# Patient Record
Sex: Male | Born: 2005 | Race: White | Hispanic: No | Marital: Single | Smoking: Never smoker
Health system: Southern US, Community
[De-identification: ages and names within clinical notes are randomized; demographics above are authoritative.]

## PROBLEM LIST (undated history)

## (undated) DIAGNOSIS — K219 Gastro-esophageal reflux disease without esophagitis: Secondary | ICD-10-CM

## (undated) DIAGNOSIS — G8929 Other chronic pain: Secondary | ICD-10-CM

---
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
## 2023-03-05 ENCOUNTER — Ambulatory Visit: Payer: 59 | Admitting: Nurse Practitioner

## 2023-03-05 ENCOUNTER — Encounter: Payer: Self-pay | Admitting: Nurse Practitioner

## 2023-03-05 VITALS — BP 108/62 | HR 84 | Temp 98.6°F | Ht 69.0 in | Wt 132.0 lb

## 2023-03-05 DIAGNOSIS — K219 Gastro-esophageal reflux disease without esophagitis: Secondary | ICD-10-CM

## 2023-03-05 DIAGNOSIS — Z0001 Encounter for general adult medical examination with abnormal findings: Secondary | ICD-10-CM | POA: Insufficient documentation

## 2023-03-05 MED ORDER — OMEPRAZOLE 20 MG PO CPDR: 20.0000 mg | DELAYED_RELEASE_CAPSULE | Freq: Every day | ORAL | 3 refills | Status: DC

## 2023-03-05 NOTE — Assessment & Plan Note (Addendum)
He experiences recurrent episodes of severe stomach pain and vomiting, with a history of epigastric pain. Previously, omeprazole provided some relief. Symptoms seem consistent with acid reflux. We will start omeprazole 20 mg daily and advise him to monitor his diet, avoiding spicy, greasy, and acidic foods, caffeine, and alcohol. Discussed increasing dose to 40mg  daily if 20mg  is not providing enough relief. He should check in if symptoms worsen or change. Dietary information handout provided to patient.

## 2023-03-05 NOTE — Progress Notes (Signed)
Bethanie Dicker, NP-C Phone: [phone]  Allen Kim is a 17 y.o. male who presents today to establish care and for annual exam.   Discussed the use of AI scribe software for clinical note transcription with the patient, who gave verbal consent to proceed.  History of Present Illness   The 17 year old patient, with no significant past medical history or daily medications, presents with recurrent episodes of severe stomachache. The patient describes the pain as a burning sensation, more intense than any previous stomachaches. These episodes, which have occurred three times over the past semester, often result in vomiting, typically of acid first thing in the morning. The patient suspects these episodes may be related to inadequate food intake due to an irregular eating schedule at school.   Several years ago, the patient experienced epigastric pain, which was notably severe at night. This pain has since decreased in frequency and intensity, occurring approximately twice in the past year and lasting no more than two hours. During that time, the patient was prescribed omeprazole, which provided short-term relief. The patient still has some of this medication.  The patient maintains an active lifestyle, including weightlifting and playing tennis. He is sexually active and has received both the flu and 4 COVID vaccines. The patient denies any chest pain, shortness of breath, difficulty using the bathroom, painful sex, headaches, dizziness, skin changes, rashes, swelling in the legs, joint pains, and significant mood, anxiety, or depression issues.      Active Ambulatory Problems    Diagnosis Date Noted   Encounter for routine adult health examination with abnormal findings 03/05/2023   Gastroesophageal reflux disease without esophagitis 03/05/2023   Resolved Ambulatory Problems    Diagnosis Date Noted   No Resolved Ambulatory Problems   No Additional Past Medical History    Family History   Problem Relation Age of Onset   Diabetes Father    Stroke Maternal Grandmother    Diabetes Maternal Grandmother    Cancer Maternal Grandfather        prostate   Hearing loss Maternal Grandfather    Diabetes Paternal Grandmother    Mental illness Paternal Grandmother    Cancer Paternal Grandfather        melanoma   Hearing loss Paternal Grandfather     Social History   Socioeconomic History   Marital status: Single    Spouse name: Not on file   Number of children: Not on file   Years of education: Not on file   Highest education level: Not on file  Occupational History   Not on file  Tobacco Use   Smoking status: Never   Smokeless tobacco: Never  Substance and Sexual Activity   Alcohol use: Never   Drug use: Never   Sexual activity: Never  Other Topics Concern   Not on file  Social History Narrative   Not on file   Social Drivers of Health   Financial Resource Strain: Not on file  Food Insecurity: Not on file  Transportation Needs: Not on file  Physical Activity: Not on file  Stress: Not on file  Social Connections: Not on file  Intimate Partner Violence: Not on file    ROS  General:  Negative for unexplained weight loss, fever Skin: Negative for new or changing mole, sore that won't heal HEENT: Negative for trouble hearing, trouble seeing, ringing in ears, mouth sores, hoarseness, change in voice, dysphagia. CV:  Negative for chest pain, dyspnea, edema, palpitations Resp: Negative for cough, dyspnea, hemoptysis GI:  Negative for nausea, vomiting, diarrhea, constipation, melena, hematochezia. GU: Negative for dysuria, incontinence, urinary hesitance, hematuria, vaginal or penile discharge, polyuria, sexual difficulty, lumps in testicle or breasts MSK: Negative for muscle cramps or aches, joint pain or swelling Neuro: Negative for headaches, weakness, numbness, dizziness, passing out/fainting Psych: Negative for depression, anxiety, memory  problems  Objective  Physical Exam Vitals:   03/05/23 0948  BP: 108/62  Pulse: 84  Temp: 98.6 F (37 C)  SpO2: 99%    BP Readings from Last 3 Encounters:  03/05/23 108/62  02/11/18 (!) 113/58   Wt Readings from Last 3 Encounters:  03/05/23 132 lb (59.9 kg) (15%, Z= -1.05)*  02/11/18 95 lb 7.4 oz (43.3 kg) (11%, Z= -1.21)*   * Growth percentiles are based on CDC (Boys, 2-20 Years) data.    Physical Exam Constitutional:      General: He is not in acute distress.    Appearance: Normal appearance.  HENT:     Head: Normocephalic.     Right Ear: Tympanic membrane normal.     Left Ear: Tympanic membrane normal.     Nose: Nose normal.     Mouth/Throat:     Mouth: Mucous membranes are moist.     Pharynx: Oropharynx is clear.  Eyes:     Conjunctiva/sclera: Conjunctivae normal.     Pupils: Pupils are equal, round, and reactive to light.  Neck:     Thyroid: No thyromegaly.  Cardiovascular:     Rate and Rhythm: Normal rate and regular rhythm.     Heart sounds: Normal heart sounds.  Pulmonary:     Effort: Pulmonary effort is normal.     Breath sounds: Normal breath sounds.  Abdominal:     General: Abdomen is flat. Bowel sounds are normal.     Palpations: Abdomen is soft. There is no mass.     Tenderness: There is no abdominal tenderness.  Musculoskeletal:        General: Normal range of motion.  Lymphadenopathy:     Cervical: No cervical adenopathy.  Skin:    General: Skin is warm and dry.     Findings: No rash.  Neurological:     General: No focal deficit present.     Mental Status: He is alert.  Psychiatric:        Mood and Affect: Mood normal.        Behavior: Behavior normal.    Assessment/Plan:  Encounter for routine adult health examination with abnormal findings Assessment & Plan: Physical exam complete. Declined lab work today which I feel is reasonable given age and health status. He is up to date on vaccines. We encourage him to maintain regular  exercise and a balanced diet, continue with regular dental and eye check-ups, and contact us if any new issues arise. Return to care in one year, sooner as needed.    Gastroesophageal reflux disease without esophagitis Assessment & Plan: He experiences recurrent episodes of severe stomach pain and vomiting, with a history of epigastric pain. Previously, omeprazole provided some relief. Symptoms seem consistent with acid reflux. We will start omeprazole 20 mg daily and advise him to monitor his diet, avoiding spicy, greasy, and acidic foods, caffeine, and alcohol. Discussed increasing dose to 40mg  daily if 20mg  is not providing enough relief. He should check in if symptoms worsen or change. Dietary information handout provided to patient.   Orders: -     Omeprazole; Take 1 capsule (20 mg total) by mouth daily.  Dispense: 90  capsule; Refill: 3   Return in about 1 year (around 03/04/2024) for Annual Exam, sooner as needed.   Bethanie Dicker, NP-C [facility] [employer]

## 2023-03-05 NOTE — Assessment & Plan Note (Signed)
Physical exam complete. Declined lab work today which I feel is reasonable given age and health status. He is up to date on vaccines. We encourage him to maintain regular exercise and a balanced diet, continue with regular dental and eye check-ups, and contact us if any new issues arise. Return to care in one year, sooner as needed.

## 2023-07-01 ENCOUNTER — Other Ambulatory Visit (HOSPITAL_COMMUNITY): Payer: Self-pay

## 2023-07-01 ENCOUNTER — Telehealth: Payer: Self-pay

## 2023-07-01 NOTE — Telephone Encounter (Signed)
 Pharmacy Patient Advocate Encounter   Received notification from CoverMyMeds that prior authorization for Omeprazole 20MG  dr capsules is required/requested.   Insurance verification completed.   The patient is insured through [facility] .   Per test claim: PA required; PA submitted to above mentioned insurance via CoverMyMeds Key/confirmation #/EOC [id number] Status is pending

## 2023-07-01 NOTE — Telephone Encounter (Signed)
 Pharmacy Patient Advocate Encounter  Received notification from [facility] that Prior Authorization for OMEPRAZOLE 20MG  has been APPROVED from 07/01/2023 to 06/30/2024. Ran test claim, Copay is $1.03/ 30 DAY SUPPLY. This test claim was processed through [facility]- copay amounts may vary at other pharmacies due to pharmacy/plan contracts, or as the patient moves through the different stages of their insurance plan.   PA #/Case ID/Reference #: [id number]

## 2023-07-03 DIAGNOSIS — M9903 Segmental and somatic dysfunction of lumbar region: Secondary | ICD-10-CM | POA: Diagnosis not present

## 2023-07-03 DIAGNOSIS — M546 Pain in thoracic spine: Secondary | ICD-10-CM | POA: Diagnosis not present

## 2023-07-03 DIAGNOSIS — M542 Cervicalgia: Secondary | ICD-10-CM | POA: Diagnosis not present

## 2023-07-03 DIAGNOSIS — M9901 Segmental and somatic dysfunction of cervical region: Secondary | ICD-10-CM | POA: Diagnosis not present

## 2023-07-03 DIAGNOSIS — M9902 Segmental and somatic dysfunction of thoracic region: Secondary | ICD-10-CM | POA: Diagnosis not present

## 2023-08-10 ENCOUNTER — Ambulatory Visit: Payer: Self-pay

## 2023-08-16 ENCOUNTER — Ambulatory Visit: Payer: Self-pay

## 2023-08-16 DIAGNOSIS — H6592 Unspecified nonsuppurative otitis media, left ear: Secondary | ICD-10-CM | POA: Diagnosis not present

## 2023-08-16 DIAGNOSIS — H66002 Acute suppurative otitis media without spontaneous rupture of ear drum, left ear: Secondary | ICD-10-CM | POA: Diagnosis not present

## 2023-08-18 DIAGNOSIS — M9903 Segmental and somatic dysfunction of lumbar region: Secondary | ICD-10-CM | POA: Diagnosis not present

## 2023-08-18 DIAGNOSIS — M546 Pain in thoracic spine: Secondary | ICD-10-CM | POA: Diagnosis not present

## 2023-08-18 DIAGNOSIS — M9901 Segmental and somatic dysfunction of cervical region: Secondary | ICD-10-CM | POA: Diagnosis not present

## 2023-08-18 DIAGNOSIS — M9902 Segmental and somatic dysfunction of thoracic region: Secondary | ICD-10-CM | POA: Diagnosis not present

## 2023-08-18 DIAGNOSIS — M542 Cervicalgia: Secondary | ICD-10-CM | POA: Diagnosis not present

## 2023-10-03 ENCOUNTER — Other Ambulatory Visit: Payer: Self-pay

## 2023-10-03 ENCOUNTER — Encounter: Payer: Self-pay | Admitting: Nurse Practitioner

## 2023-10-03 ENCOUNTER — Encounter: Payer: Self-pay | Admitting: Emergency Medicine

## 2023-10-03 ENCOUNTER — Ambulatory Visit

## 2023-10-03 DIAGNOSIS — D72829 Elevated white blood cell count, unspecified: Secondary | ICD-10-CM | POA: Insufficient documentation

## 2023-10-03 DIAGNOSIS — R1084 Generalized abdominal pain: Secondary | ICD-10-CM | POA: Diagnosis not present

## 2023-10-03 DIAGNOSIS — K529 Noninfective gastroenteritis and colitis, unspecified: Secondary | ICD-10-CM | POA: Insufficient documentation

## 2023-10-03 DIAGNOSIS — R197 Diarrhea, unspecified: Secondary | ICD-10-CM | POA: Diagnosis not present

## 2023-10-03 NOTE — ED Triage Notes (Signed)
 Pt arrives c/o upper/mid abdominal pain x 3 days. Pt states this pain has been a recurring problem the past couple months, and pain usually subsides after one or two days but now patient is on day three and is c/o n/v/d. Seen at UC earlier but denies imaging studies.

## 2023-10-04 ENCOUNTER — Emergency Department

## 2023-10-04 ENCOUNTER — Emergency Department
Admission: EM | Admit: 2023-10-04 | Discharge: 2023-10-04 | Disposition: A | Discharge status: Home / Self Care | Attending: Emergency Medicine | Admitting: Emergency Medicine

## 2023-10-04 DIAGNOSIS — R109 Unspecified abdominal pain: Secondary | ICD-10-CM | POA: Diagnosis not present

## 2023-10-04 DIAGNOSIS — K529 Noninfective gastroenteritis and colitis, unspecified: Secondary | ICD-10-CM

## 2023-10-04 DIAGNOSIS — R1084 Generalized abdominal pain: Secondary | ICD-10-CM

## 2023-10-04 DIAGNOSIS — R188 Other ascites: Secondary | ICD-10-CM | POA: Diagnosis not present

## 2023-10-04 LAB — LIPASE, BLOOD: Lipase: 25 U/L (ref 11–51)

## 2023-10-04 LAB — URINALYSIS, ROUTINE W REFLEX MICROSCOPIC
Bacteria, UA: NONE SEEN
Bilirubin Urine: NEGATIVE
Glucose, UA: NEGATIVE mg/dL
Hgb urine dipstick: NEGATIVE
Ketones, ur: 20 mg/dL — AB
Leukocytes,Ua: NEGATIVE
Nitrite: NEGATIVE
Protein, ur: 30 mg/dL — AB
Specific Gravity, Urine: 1.017 (ref 1.005–1.030)
Squamous Epithelial / HPF: 0 /HPF (ref 0–5)
pH: 6 (ref 5.0–8.0)

## 2023-10-04 LAB — COMPREHENSIVE METABOLIC PANEL WITH GFR
ALT: 12 U/L (ref 0–44)
AST: 14 U/L — ABNORMAL LOW (ref 15–41)
Albumin: 4.2 g/dL (ref 3.5–5.0)
Alkaline Phosphatase: 59 U/L (ref 38–126)
Anion gap: 13 (ref 5–15)
BUN: 11 mg/dL (ref 6–20)
CO2: 25 mmol/L (ref 22–32)
Calcium: 9.3 mg/dL (ref 8.9–10.3)
Chloride: 97 mmol/L — ABNORMAL LOW (ref 98–111)
Creatinine, Ser: 0.87 mg/dL (ref 0.61–1.24)
GFR, Estimated: >60 mL/min (ref >60)
Glucose, Bld: 115 mg/dL — ABNORMAL HIGH (ref 70–99)
Potassium: 3.5 mmol/L (ref 3.5–5.1)
Sodium: 135 mmol/L (ref 135–145)
Total Bilirubin: 1.7 mg/dL — ABNORMAL HIGH (ref 0.0–1.2)
Total Protein: 7.8 g/dL (ref 6.5–8.1)

## 2023-10-04 LAB — CBC
HCT: 42.6 % (ref 39.0–52.0)
Hemoglobin: 15.1 g/dL (ref 13.0–17.0)
MCH: 29.3 pg (ref 26.0–34.0)
MCHC: 35.4 g/dL (ref 30.0–36.0)
MCV: 82.6 fL (ref 80.0–100.0)
Platelets: 293 K/uL (ref 150–400)
RBC: 5.16 MIL/uL (ref 4.22–5.81)
RDW: 12 % (ref 11.5–15.5)
WBC: 15.9 K/uL — ABNORMAL HIGH (ref 4.0–10.5)
nRBC: 0 % (ref 0.0–0.2)

## 2023-10-04 MED ORDER — ONDANSETRON 4 MG PO TBDP: ORAL_TABLET | ORAL | 0 refills | Status: DC

## 2023-10-04 MED ORDER — IOHEXOL 300 MG/ML  SOLN
100.0000 mL | Freq: Once | INTRAMUSCULAR | Status: AC | PRN
  Administered 2023-10-04: 100 mL via INTRAVENOUS

## 2023-10-04 MED ORDER — ONDANSETRON HCL 4 MG/2ML IJ SOLN
4.0000 mg | INTRAMUSCULAR | Status: AC
  Administered 2023-10-04: 4 mg via INTRAVENOUS
  Filled 2023-10-04: qty 2

## 2023-10-04 MED ORDER — MORPHINE SULFATE (PF) 4 MG/ML IV SOLN
4.0000 mg | Freq: Once | INTRAVENOUS | Status: AC
  Administered 2023-10-04: 4 mg via INTRAVENOUS
  Filled 2023-10-04: qty 1

## 2023-10-04 MED ORDER — LACTATED RINGERS IV BOLUS
1000.0000 mL | Freq: Once | INTRAVENOUS | Status: AC
  Administered 2023-10-04: 1000 mL via INTRAVENOUS

## 2023-10-04 MED ORDER — HYDROCODONE-ACETAMINOPHEN 5-325 MG PO TABS: 2.0000 | ORAL_TABLET | Freq: Four times a day (QID) | ORAL | 0 refills | Status: DC | PRN

## 2023-10-04 NOTE — Discharge Instructions (Addendum)
 As we discussed, your evaluation was generally reassuring and your symptoms are caused by a condition called colitis, which is an infectious or inflammatory condition of the large intestine (the colon) they can lead to all the symptoms you are describing.  It typically resolves on its own and we do not usually prescribe antibiotics for this type of condition.  However, for an otherwise healthy young person with these kinds of symptoms, we strongly encourage you to follow-up with your primary care provider and/or with a GI specialist such as Dr. Unk.  They may want to do some additional testing, such as an outpatient test called a fecal calprotectin, or imaging such as a colonoscopy and upper endoscopy, to further investigate the possibility of inflammatory bowel disease (IBD) such as Crohn's disease or ulcerative colitis.  We included some information about Crohn's disease for you to read, but please understand that this is NOT your diagnosis, just a possibility that you may want to further investigate as an outpatient.  You can use over-the-counter ibuprofen and/or Tylenol  according to label instructions as needed for pain.  Take Norco as prescribed for severe pain. Do not drink alcohol, drive or participate in any other potentially dangerous activities while taking this medication as it may make you sleepy. Do not take this medication with any other sedating medications, either prescription or over-the-counter. If you were prescribed Percocet or Vicodin, do not take these with acetaminophen  (Tylenol ) as it is already contained within these medications.   This medication is an opiate (or narcotic) pain medication and can be habit forming.  Use it as little as possible to achieve adequate pain control.  Do not use or use it with extreme caution if you have a history of opiate abuse or dependence.  If you are on a pain contract with your primary care doctor or a pain specialist, be sure to let them know you  were prescribed this medication today from the [facility] Emergency Department.  This medication is intended for your use only - do not give any to anyone else and keep it in a secure place where nobody else, especially children, have access to it.  It will also cause or worsen constipation, so you may want to consider taking an over-the-counter stool softener while you are taking this medication.    Return to the emergency department if you develop new or worsening symptoms that concern you.

## 2023-10-04 NOTE — ED Provider Notes (Signed)
 [facility] Provider Note    Event Date/Time   First MD Initiated Contact with Patient 10/04/23 0028     (approximate)   History   Abdominal Pain   HPI Allen Kim is a 18 y.o. male with no chronic medical issues and who is otherwise healthy and active.  He presents for evaluation of pain in the middle and lower part of his abdomen that has been present for about 3 days but is worse today.  He said it is happened 4 or 5 times over the last year and does not seem to be related to anything in particular such as eating.  He has some nausea and vomiting.  He said he was constipated the previous day, then yesterday it was more diarrhea, and then by tonight there was some blood mixed in with the stool which is never happened before.  He went to an urgent care earlier but did not receive any imaging.  He said the pain is little bit better now than it was before but is still persistent.  It does not radiate to his back nor down into his groin nor genitals.     Physical Exam   Triage Vital Signs: ED Triage Vitals  Encounter Vitals Group     BP 10/03/23 2355 124/76     Girls Systolic BP Percentile --      Girls Diastolic BP Percentile --      Boys Systolic BP Percentile --      Boys Diastolic BP Percentile --      Pulse Rate 10/03/23 2355 (!) 114     Resp 10/03/23 2355 18     Temp 10/03/23 2355 99.5 F (37.5 C)     Temp src --      SpO2 10/03/23 2355 98 %     Weight 10/03/23 2356 55.8 kg (123 lb)     Height 10/03/23 2356 1.753 m (5' 9)     Head Circumference --      Peak Flow --      Pain Score 10/03/23 2356 5     Pain Loc --      Pain Education --      Exclude from Growth Chart --     Most recent vital signs: Vitals:   10/04/23 0230 10/04/23 0300  BP: 120/72 119/68  Pulse: 91 92  Resp:  18  Temp:    SpO2: 100% 99%    General: Awake, appears a little bit uncomfortable but not in severe distress.  Appears generally healthy. CV:  Good  peripheral perfusion.  Tachycardia, regular rhythm. Resp:  Normal effort. Speaking easily and comfortably, no accessory muscle usage nor intercostal retractions.   Abd:  Thin and muscular body habitus.  Abdomen soft with guarding and tenderness to palpation throughout, no specific tenderness in the epigastrium nor right upper quadrant.  Tenderness is worse in the lower middle part of the abdomen in particular the periumbilical region.   ED Results / Procedures / Treatments   Labs (all labs ordered are listed, but only abnormal results are displayed) Labs Reviewed  COMPREHENSIVE METABOLIC PANEL WITH GFR - Abnormal; Notable for the following components:      Result Value   Chloride 97 (*)    Glucose, Bld 115 (*)    AST 14 (*)    Total Bilirubin 1.7 (*)    All other components within normal limits  CBC - Abnormal; Notable for the following components:   WBC 15.9 (*)  All other components within normal limits  URINALYSIS, ROUTINE W REFLEX MICROSCOPIC - Abnormal; Notable for the following components:   Color, Urine YELLOW (*)    APPearance CLEAR (*)    Ketones, ur 20 (*)    Protein, ur 30 (*)    All other components within normal limits  LIPASE, BLOOD      RADIOLOGY See ED course for details   PROCEDURES:  Critical Care performed: No  Procedures    IMPRESSION / MDM / ASSESSMENT AND PLAN / ED COURSE  I reviewed the triage vital signs and the nursing notes.                              Differential diagnosis includes, but is not limited to, appendicitis, biliary colic, IBD.  Patient's presentation is most consistent with acute presentation with potential threat to life or bodily function.  Labs/studies ordered: CMP, lipase, CBC, urinalysis, CT of the abdomen and pelvis with oral and IV contrast.  Interventions/Medications given:  Medications  lactated ringers  bolus 1,000 mL (0 mLs Intravenous Stopped 10/04/23 0329)  morphine  (PF) 4 MG/ML injection 4 mg (4 mg  Intravenous Given 10/04/23 0143)  ondansetron  (ZOFRAN ) injection 4 mg (4 mg Intravenous Given 10/04/23 0142)  iohexol  (OMNIPAQUE ) 300 MG/ML solution 100 mL (100 mLs Intravenous Contrast Given 10/04/23 0331)    (Note:  hospital course my include additional interventions and/or labs/studies not listed above.)   Tachycardia likely due to volume depletion, further suggested by the ketones in his urine.  He has a leukocytosis of about 16 but this is nonspecific in an otherwise healthy young male and could be a stress reaction to the vomiting.  CMP is essentially normal, very slightly elevated bilirubin, likely noncontributory.  I will evaluate with a CT scan given that he has had no prior imaging, but I am most concerned about the possibility of IBD.  I talked with the patient and his mother about this and explained that we will rule out acute/emergent conditions tonight such as appendicitis, but he may be appropriate for close outpatient follow-up with GI.  They understand and agree to proceed with a CT scan with oral and IV contrast.  I ordered morphine  4 mg IV and Zofran  4 mg IV for pain and nausea as well as 1 L of IV fluids.    Clinical Course as of 10/04/23 0403  Sat Oct 04, 2023  0357 CT ABDOMEN PELVIS W CONTRAST I independently viewed and interpreted the patient's abd/pelvis CT, as well as reviewing the radiologist's report.  Patient has no obvious sign of appendicitis or biliary disease.  He has some inflammatory changes in the colon and the radiologist confirmed infectious versus inflammatory colitis.  I reassessed the patient and he is feeling better.  I updated him and his mother and reiterated the possibility of IBD but also explained that this could simply be colitis that will resolve on its own (I explained that typically antibiotics are not given for even infectious colitis).  I will prescribe some Norco and Zofran  and strongly encouraged close PCP and GI follow-up.  They understand and  agree with the plan.  The patient's medical screening exam is reassuring with no indication of an emergent medical condition requiring hospitalization or additional evaluation at this point.  The patient is safe and appropriate for discharge and outpatient follow up.  I gave my usual and customary return precautions. [CF]    Clinical Course  User Index [CF] Gordan Huxley, MD     FINAL CLINICAL IMPRESSION(S) / ED DIAGNOSES   Final diagnoses:  Colitis  Generalized abdominal pain     Rx / DC Orders   ED Discharge Orders          Ordered    HYDROcodone -acetaminophen  (NORCO/VICODIN) 5-325 MG tablet  Every 6 hours PRN        10/04/23 0401    ondansetron  (ZOFRAN -ODT) 4 MG disintegrating tablet        10/04/23 0401             Note:  This document was prepared using Dragon voice recognition software and may include unintentional dictation errors.   Gordan Huxley, MD 10/04/23 [id number]

## 2023-10-16 ENCOUNTER — Ambulatory Visit: Admitting: Nurse Practitioner

## 2023-10-16 ENCOUNTER — Encounter: Payer: Self-pay | Admitting: Gastroenterology

## 2023-10-27 ENCOUNTER — Ambulatory Visit: Payer: Self-pay | Admitting: Anesthesiology

## 2023-10-27 ENCOUNTER — Encounter: Payer: Self-pay | Admitting: Gastroenterology

## 2023-10-27 ENCOUNTER — Other Ambulatory Visit: Payer: Self-pay

## 2023-10-27 ENCOUNTER — Encounter
Admission: RE | Disposition: A | Discharge status: Home / Self Care | Payer: Self-pay | Source: Home / Self Care | Attending: Gastroenterology

## 2023-10-27 ENCOUNTER — Ambulatory Visit
Admission: RE | Admit: 2023-10-27 | Discharge: 2023-10-27 | Disposition: A | Discharge status: Home / Self Care | Attending: Gastroenterology | Admitting: Gastroenterology

## 2023-10-27 DIAGNOSIS — K219 Gastro-esophageal reflux disease without esophagitis: Secondary | ICD-10-CM | POA: Diagnosis not present

## 2023-10-27 DIAGNOSIS — K529 Noninfective gastroenteritis and colitis, unspecified: Secondary | ICD-10-CM | POA: Insufficient documentation

## 2023-10-27 DIAGNOSIS — R1084 Generalized abdominal pain: Secondary | ICD-10-CM | POA: Diagnosis not present

## 2023-10-27 DIAGNOSIS — K2950 Unspecified chronic gastritis without bleeding: Secondary | ICD-10-CM | POA: Insufficient documentation

## 2023-10-27 DIAGNOSIS — R109 Unspecified abdominal pain: Secondary | ICD-10-CM | POA: Insufficient documentation

## 2023-10-27 HISTORY — DX: Other chronic pain: G89.29

## 2023-10-27 HISTORY — DX: Gastro-esophageal reflux disease without esophagitis: K21.9

## 2023-10-27 HISTORY — PX: ESOPHAGOGASTRODUODENOSCOPY: SHX5428

## 2023-10-27 HISTORY — PX: COLONOSCOPY: SHX5424

## 2023-10-27 SURGERY — COLONOSCOPY
Anesthesia: General

## 2023-10-27 MED ORDER — PROPOFOL 10 MG/ML IV BOLUS
INTRAVENOUS | Status: DC | PRN
  Administered 2023-10-27: 200 mg via INTRAVENOUS
  Administered 2023-10-27 (×2): 140 ug/kg/min via INTRAVENOUS
  Administered 2023-10-27: 200 mg via INTRAVENOUS

## 2023-10-27 MED ORDER — GLYCOPYRROLATE 0.2 MG/ML IJ SOLN
INTRAMUSCULAR | Status: AC
  Filled 2023-10-27: qty 1

## 2023-10-27 MED ORDER — LACTATED RINGERS IV SOLN: INTRAVENOUS | Status: DC

## 2023-10-27 MED ORDER — SODIUM CHLORIDE 0.9 % IV SOLN: INTRAVENOUS | Status: DC

## 2023-10-27 MED ORDER — GLYCOPYRROLATE 0.2 MG/ML IJ SOLN
INTRAMUSCULAR | Status: DC | PRN
  Administered 2023-10-27 (×2): .2 mg via INTRAVENOUS

## 2023-10-27 MED ORDER — LIDOCAINE HCL (PF) 2 % IJ SOLN
INTRAMUSCULAR | Status: AC
  Filled 2023-10-27: qty 5

## 2023-10-27 MED ORDER — LIDOCAINE HCL (CARDIAC) PF 100 MG/5ML IV SOSY
PREFILLED_SYRINGE | INTRAVENOUS | Status: DC | PRN
  Administered 2023-10-27 (×2): 60 mg via INTRAVENOUS

## 2023-10-27 SURGICAL SUPPLY — 16 items
CLIP HMST 235XBRD CATH ROT (MISCELLANEOUS) IMPLANT
ELECTRODE REM PT RTRN 9FT ADLT (ELECTROSURGICAL) IMPLANT
FORCEPS BIOP RAD 4 LRG CAP 4 (CUTTING FORCEPS) IMPLANT
FORCEPS ESCP3.2XJMB 240X2.8X (MISCELLANEOUS) IMPLANT
GAUZE SPONGE 4X4 12PLY STRL (GAUZE/BANDAGES/DRESSINGS) IMPLANT
GOWN CVR UNV OPN BCK APRN NK (MISCELLANEOUS) ×2 IMPLANT
INJECTOR VARIJECT VIN23 (MISCELLANEOUS) IMPLANT
KIT DEFENDO VALVE AND CONN (KITS) IMPLANT
KIT PRC NS LF DISP ENDO (KITS) ×1 IMPLANT
MANIFOLD NEPTUNE II (INSTRUMENTS) ×1 IMPLANT
MARKER SPOT ENDO TATTOO 5ML (MISCELLANEOUS) IMPLANT
PROBE APC STR FIRE (PROBE) IMPLANT
RETRIEVER NET ROTH 2.5X230 LF (MISCELLANEOUS) IMPLANT
SNARE COLD EXACTO (MISCELLANEOUS) IMPLANT
TRAP ETRAP POLY (MISCELLANEOUS) IMPLANT
WATER STERILE IRR 250ML POUR (IV SOLUTION) ×1 IMPLANT

## 2023-10-27 NOTE — Anesthesia Preprocedure Evaluation (Addendum)
 Anesthesia Evaluation  Patient identified by MRN, date of birth, ID band Patient awake    Reviewed: Allergy & Precautions, H&P , NPO status , Patient's Chart, lab work & pertinent test results  Airway Mallampati: II  TM Distance: >3 FB Neck ROM: Full    Dental no notable dental hx.    Pulmonary neg pulmonary ROS   Pulmonary exam normal breath sounds clear to auscultation       Cardiovascular negative cardio ROS Normal cardiovascular exam Rhythm:Regular Rate:Normal     Neuro/Psych negative neurological ROS  negative psych ROS   GI/Hepatic negative GI ROS, Neg liver ROS,GERD  ,,  Endo/Other  negative endocrine ROS    Renal/GU negative Renal ROS  negative genitourinary   Musculoskeletal negative musculoskeletal ROS (+)    Abdominal   Peds negative pediatric ROS (+)  Hematology negative hematology ROS (+)   Anesthesia Other Findings GERD Chronic neck and back pain   Reproductive/Obstetrics negative OB ROS                              Anesthesia Physical Anesthesia Plan  ASA: 2  Anesthesia Plan: General   Post-op Pain Management:    Induction: Intravenous  PONV Risk Score and Plan:   Airway Management Planned: Natural Airway and Nasal Cannula  Additional Equipment:   Intra-op Plan:   Post-operative Plan:   Informed Consent: I have reviewed the patients History and Physical, chart, labs and discussed the procedure including the risks, benefits and alternatives for the proposed anesthesia with the patient or authorized representative who has indicated his/her understanding and acceptance.     Dental Advisory Given  Plan Discussed with: Anesthesiologist, CRNA and Surgeon  Anesthesia Plan Comments: (Patient consented for risks of anesthesia including but not limited to:  - adverse reactions to medications - risk of airway placement if required - damage to eyes, teeth,  lips or other oral mucosa - nerve damage due to positioning  - sore throat or hoarseness - Damage to heart, brain, nerves, lungs, other parts of body or loss of life  Patient voiced understanding and assent.)         Anesthesia Quick Evaluation

## 2023-10-27 NOTE — Op Note (Signed)
 [facility] Gastroenterology Patient Name: Allen Kim Procedure Date: 10/27/2023 8:28 AM MRN: [id number] Account #: [id number] Date of Birth: 12/22/2005 Admit Type: Outpatient Age: 18 Room: [facility] OR ROOM 01 Gender: Male Note Status: Finalized Instrument Name: Endoscope [id number] Procedure:             Upper GI endoscopy Indications:           Diarrhea Providers:             Corinn Jess Brooklyn MD, MD Referring MD:          Leron Glance (Referring MD) Medicines:             General Anesthesia Complications:         No immediate complications. Estimated blood loss: None. Procedure:             Pre-Anesthesia Assessment:                        - Prior to the procedure, a History and Physical was                         performed, and patient medications and allergies were                         reviewed. The patient is competent. The risks and                         benefits of the procedure and the sedation options and                         risks were discussed with the patient. All questions                         were answered and informed consent was obtained.                         Patient identification and proposed procedure were                         verified by the physician, the nurse, the                         anesthesiologist, the anesthetist and the technician                         in the pre-procedure area in the procedure room in the                         endoscopy suite. Mental Status Examination: alert and                         oriented. Airway Examination: normal oropharyngeal                         airway and neck mobility. Respiratory Examination:                         clear to auscultation. CV Examination: normal.  Prophylactic Antibiotics: The patient does not require                         prophylactic antibiotics. Prior Anticoagulants: The                         patient has taken no anticoagulant or  antiplatelet                         agents. ASA Grade Assessment: II - A patient with mild                         systemic disease. After reviewing the risks and                         benefits, the patient was deemed in satisfactory                         condition to undergo the procedure. The anesthesia                         plan was to use general anesthesia. Immediately prior                         to administration of medications, the patient was                         re-assessed for adequacy to receive sedatives. The                         heart rate, respiratory rate, oxygen saturations,                         blood pressure, adequacy of pulmonary ventilation, and                         response to care were monitored throughout the                         procedure. The physical status of the patient was                         re-assessed after the procedure.                        After obtaining informed consent, the endoscope was                         passed under direct vision. Throughout the procedure,                         the patient's blood pressure, pulse, and oxygen                         saturations were monitored continuously. The Endoscope                         was introduced through the mouth, and advanced to the  third part of duodenum. The upper GI endoscopy was                         accomplished without difficulty. The patient tolerated                         the procedure well. Findings:      The duodenal bulb, second portion of the duodenum and third portion of       the duodenum were normal. Biopsies were taken with a cold forceps for       histology.      The entire examined stomach was normal. Biopsies were taken with a cold       forceps for histology.      The cardia and gastric fundus were normal on retroflexion.      Esophagogastric landmarks were identified: the gastroesophageal junction       was found at  38 cm from the incisors.      The gastroesophageal junction and examined esophagus were normal. Impression:            - Normal duodenal bulb, second portion of the duodenum                         and third portion of the duodenum. Biopsied.                        - Normal stomach. Biopsied.                        - Esophagogastric landmarks identified.                        - Normal gastroesophageal junction and esophagus. Recommendation:        - Await pathology results.                        - Proceed with colonoscopy as scheduled                        See colonoscopy report Procedure Code(s):     --- Professional ---                        [phone], Esophagogastroduodenoscopy, flexible,                         transoral; with biopsy, single or multiple Diagnosis Code(s):     --- Professional ---                        R19.7, Diarrhea, unspecified CPT copyright 2022 American [employer]. All rights reserved. The codes documented in this report are preliminary and upon coder review may  be revised to meet current compliance requirements. Dr. Corinn Brooklyn Corinn Jess Brooklyn MD, MD 10/27/2023 8:45:18 AM This report has been signed electronically. Number of Addenda: 0 Note Initiated On: 10/27/2023 8:28 AM Total Procedure Duration: 0 hours 4 minutes 10 seconds  Estimated Blood Loss:  Estimated blood loss: none.      [facility]

## 2023-10-27 NOTE — Transfer of Care (Addendum)
 Immediate Anesthesia Transfer of Care Note  Patient: Allen Kim [facility]  Procedure(s) Performed: COLONOSCOPY EGD (ESOPHAGOGASTRODUODENOSCOPY)  Patient Location: PACU  Anesthesia Type: MAC  Level of Consciousness: sedated  Airway and Oxygen Therapy: Patient Spontanous Breathing and Patient connected to supplemental oxygen  Post-op Assessment: Post-op Vital signs reviewed, Patient's Cardiovascular Status Stable, Respiratory Function Stable, Patent Airway and No signs of Nausea or vomiting  Post-op Vital Signs: Reviewed and stable  Complications: No notable events documented.

## 2023-10-27 NOTE — Op Note (Signed)
 [facility] Gastroenterology Patient Name: Allen Kim Procedure Date: 10/27/2023 8:26 AM MRN: [id number] Account #: [id number] Date of Birth: September 18, 2005 Admit Type: Outpatient Age: 18 Room: [facility] OR ROOM 01 Gender: Male Note Status: Supervisor Override Instrument Name: Colonoscope [id number] Procedure:             Colonoscopy Indications:           This is the patient's first colonoscopy, Generalized                         abdominal pain, Chronic diarrhea, Clinically                         significant diarrhea of unexplained origin Providers:             Corinn Jess Brooklyn MD, MD Referring MD:          Leron Glance (Referring MD) Medicines:             General Anesthesia Complications:         No immediate complications. Estimated blood loss: None. Procedure:             Pre-Anesthesia Assessment:                        - Prior to the procedure, a History and Physical was                         performed, and patient medications and allergies were                         reviewed. The patient is competent. The risks and                         benefits of the procedure and the sedation options and                         risks were discussed with the patient. All questions                         were answered and informed consent was obtained.                         Patient identification and proposed procedure were                         verified by the physician, the nurse, the                         anesthesiologist, the anesthetist and the technician                         in the pre-procedure area in the procedure room in the                         endoscopy suite. Mental Status Examination: alert and                         oriented. Airway Examination: normal oropharyngeal  airway and neck mobility. Respiratory Examination:                         clear to auscultation. CV Examination: normal.                          Prophylactic Antibiotics: The patient does not require                         prophylactic antibiotics. Prior Anticoagulants: The                         patient has taken no anticoagulant or antiplatelet                         agents. ASA Grade Assessment: II - A patient with mild                         systemic disease. After reviewing the risks and                         benefits, the patient was deemed in satisfactory                         condition to undergo the procedure. The anesthesia                         plan was to use general anesthesia. Immediately prior                         to administration of medications, the patient was                         re-assessed for adequacy to receive sedatives. The                         heart rate, respiratory rate, oxygen saturations,                         blood pressure, adequacy of pulmonary ventilation, and                         response to care were monitored throughout the                         procedure. The physical status of the patient was                         re-assessed after the procedure.                        After obtaining informed consent, the colonoscope was                         passed under direct vision. Throughout the procedure,                         the patient's blood pressure, pulse, and oxygen  saturations were monitored continuously. The                         Colonoscope was introduced through the anus and                         advanced to the the terminal ileum, with                         identification of the appendiceal orifice and IC                         valve. The colonoscopy was performed without                         difficulty. The patient tolerated the procedure well.                         The quality of the bowel preparation was evaluated                         using the BBPS [facility] Bowel Preparation Scale) with                         scores  of: Right Colon = 3, Transverse Colon = 3 and                         Left Colon = 3 (entire mucosa seen well with no                         residual staining, small fragments of stool or opaque                         liquid). The total BBPS score equals 9. The terminal                         ileum, ileocecal valve, appendiceal orifice, and                         rectum were photographed. Findings:      The perianal and digital rectal examinations were normal. Pertinent       negatives include normal sphincter tone and no palpable rectal lesions.      The terminal ileum appeared normal. Biopsies were taken with a cold       forceps for histology.      Normal mucosa was found in the entire colon. Biopsies were taken with a       cold forceps for histology.      The retroflexed view of the distal rectum and anal verge was normal and       showed no anal or rectal abnormalities. Impression:            - The examined portion of the ileum was normal.                         Biopsied.                        - Normal  mucosa in the entire examined colon. Biopsied.                        - The distal rectum and anal verge are normal on                         retroflexion view. Recommendation:        - Discharge patient to home (with escort).                        - Resume previous diet today.                        - Continue present medications.                        - Await pathology results. Procedure Code(s):     --- Professional ---                        [id number], Colonoscopy, flexible; with biopsy, single or                         multiple Diagnosis Code(s):     --- Professional ---                        K52.9, Noninfective gastroenteritis and colitis,                         unspecified                        R19.7, Diarrhea, unspecified CPT copyright 2022 American Medical Association. All rights reserved. The codes documented in this report are preliminary and upon coder review  may  be revised to meet current compliance requirements. Dr. Corinn Brooklyn Corinn Jess Brooklyn MD, MD 10/27/2023 9:00:52 AM This report has been signed electronically. Number of Addenda: 0 Note Initiated On: 10/27/2023 8:26 AM Scope Withdrawal Time: 0 hours 9 minutes 17 seconds  Total Procedure Duration: 0 hours 11 minutes 23 seconds  Estimated Blood Loss:  Estimated blood loss: none.      [facility]

## 2023-10-27 NOTE — Anesthesia Postprocedure Evaluation (Signed)
 Anesthesia Post Note  Patient: Allen Kim [facility]  Procedure(s) Performed: COLONOSCOPY EGD (ESOPHAGOGASTRODUODENOSCOPY)  Patient location during evaluation: PACU Anesthesia Type: General Level of consciousness: awake and alert Pain management: pain level controlled Vital Signs Assessment: post-procedure vital signs reviewed and stable Respiratory status: spontaneous breathing, nonlabored ventilation, respiratory function stable and patient connected to nasal cannula oxygen Cardiovascular status: stable and blood pressure returned to baseline Postop Assessment: no apparent nausea or vomiting Anesthetic complications: no   No notable events documented.   Last Vitals:  Vitals:   10/27/23 0910 10/27/23 0915  BP: 106/65 104/65  Pulse: 74 72  Resp: 17 17  Temp: (!) 36.4 Kim   SpO2: 98% 99%    Last Pain:  Vitals:   10/27/23 0910  TempSrc:   PainSc: Asleep                 Allen Kim [name]

## 2023-10-27 NOTE — H&P (Signed)
    Corinn JONELLE Brooklyn, MD [facility], [facility] [address]  [place], [place] [address]  Main: [phone] Fax:  [phone] Pager: [phone]   Primary Care Physician:  Gretel App, NP Primary Gastroenterologist:  Dr. Corinn JONELLE Brooklyn  Pre-Procedure History & Physical: HPI:  Allen Kim is a 18 y.o. male is here for an endoscopy and colonoscopy.   Past Medical History:  Diagnosis Date   Chronic neck and back pain    GERD (gastroesophageal reflux disease)     History reviewed. No pertinent surgical history.  Prior to Admission medications   Medication Sig Start Date End Date Taking? Authorizing Provider  omeprazole  (PRILOSEC) 20 MG capsule Take 1 capsule (20 mg total) by mouth daily. 03/05/23  Yes Gretel App, NP    Allergies as of 10/16/2023   (No Known Allergies)    Family History  Problem Relation Age of Onset   Diabetes Father    Stroke Maternal Grandmother    Diabetes Maternal Grandmother    Cancer Maternal Grandfather        prostate   Hearing loss Maternal Grandfather    Diabetes Paternal Grandmother    Mental illness Paternal Grandmother    Cancer Paternal Grandfather        melanoma   Hearing loss Paternal Grandfather     Social History   Socioeconomic History   Marital status: Single    Spouse name: Not on file   Number of children: Not on file   Years of education: Not on file   Highest education level: Not on file  Occupational History   Not on file  Tobacco Use   Smoking status: Never   Smokeless tobacco: Never  Vaping Use   Vaping status: Never Used  Substance and Sexual Activity   Alcohol use: Yes    Alcohol/week: 1.0 standard drink of alcohol    Types: 1 Cans of beer per week    Comment: 1-2 beers rarely   Drug use: Never   Sexual activity: Never  Other Topics Concern   Not on file  Social History Narrative   Not on file   Social Drivers of Health   Financial Resource Strain: Not on file  Food  Insecurity: Not on file  Transportation Needs: Not on file  Physical Activity: Not on file  Stress: Not on file  Social Connections: Not on file  Intimate Partner Violence: Not on file    Review of Systems: See HPI, otherwise negative ROS  Physical Exam: BP 126/82   Pulse 97   Temp 98.7 F (37.1 C) (Temporal)   Resp 18   Ht 5' 10 (1.778 m)   Wt 50.7 kg   SpO2 99%   BMI 16.04 kg/m  General:   Alert,  pleasant and cooperative in NAD Head:  Normocephalic and atraumatic. Neck:  Supple; no masses or thyromegaly. Lungs:  Clear throughout to auscultation.    Heart:  Regular rate and rhythm. Abdomen:  Soft, nontender and nondistended. Normal bowel sounds, without guarding, and without rebound.   Neurologic:  Alert and  oriented x4;  grossly normal neurologically.  Impression/Plan: Allen Kim is here for an upper endoscopy and colonoscopy to be performed for chronic diarrhea, abdominal pain  Risks, benefits, limitations, and alternatives regarding  endoscopy and colonoscopy have been reviewed with the patient.  Questions have been answered.  All parties agreeable.   Corinn Brooklyn, MD  10/27/2023, 8:19 AM

## 2023-10-28 ENCOUNTER — Ambulatory Visit: Payer: Self-pay | Admitting: Gastroenterology

## 2023-10-28 LAB — SURGICAL PATHOLOGY

## 2024-03-09 ENCOUNTER — Ambulatory Visit: Payer: 59 | Admitting: Nurse Practitioner

## 2024-03-09 ENCOUNTER — Encounter: Payer: Self-pay | Admitting: Nurse Practitioner

## 2024-03-09 VITALS — BP 110/70 | HR 81 | Temp 98.1°F | Ht 70.0 in | Wt 127.4 lb

## 2024-03-09 DIAGNOSIS — Z23 Encounter for immunization: Secondary | ICD-10-CM | POA: Diagnosis not present

## 2024-03-09 DIAGNOSIS — Z0000 Encounter for general adult medical examination without abnormal findings: Secondary | ICD-10-CM

## 2024-03-09 NOTE — Assessment & Plan Note (Signed)
 Physical exam complete. Routine visit with no acute concerns. Previous abdominal pain resolved after medication discontinuation. No family history of colon or prostate cancer. Declined lab work today which I feel is reasonable given age and health status. Administered tetanus booster. Declines flu vaccine and additional COVID vaccines. Continue with regular dental and eye exams. Encourage continued healthy lifestyle with balanced diet and regular exercise. Advise follow-up with GI if abdominal symptoms recur. Return to care in one year, sooner as needed.

## 2024-03-09 NOTE — Progress Notes (Signed)
 " Leron Glance, NP-C Phone: [phone]  Allen Kim is a 18 y.o. male who presents today for annual exam.   Discussed the use of AI scribe software for clinical note transcription with the patient, who gave verbal consent to proceed.  History of Present Illness   Allen Kim is a 18 year old male who presents for an annual physical exam.  In August, he experienced severe abdominal pain, prompting visits to urgent care and the hospital, where he was diagnosed with colitis. Despite normal colonoscopy and endoscopy results, his symptoms improved after discontinuing a suspected medication. An antispasmodic medication was recommended, but he did not take it. Currently, he reports no abdominal pain, constipation, or diarrhea.  He has a concern about possibly having ADHD at a low level but has not pursued formal evaluation. He is not currently in school.  He maintains a good diet and exercise routine, playing tennis regularly and consuming three well-balanced meals a day, mostly prepared by his mother. He has not received a flu shot this year. His last tetanus shot was in 2015. He has received three COVID vaccinations.  He does not smoke, rarely drinks alcohol, and does not use drugs. He regularly visits a dentist and an eye doctor. He reports solid sleep, feeling well-rested during the day.  No family history of colon or prostate cancer. No chest pain, shortness of breath, urinary symptoms, headaches, dizziness, skin changes, joint pain, leg swelling, mood issues, anxiety, or depression.      Tobacco Use History[1]  Medications Ordered Prior to Encounter[2]   ROS see history of present illness  Objective  Physical Exam Vitals:   03/09/24 0855  BP: 110/70  Pulse: 81  Temp: 98.1 F (36.7 C)  SpO2: 98%    BP Readings from Last 3 Encounters:  03/09/24 110/70  10/27/23 104/65  10/04/23 116/70   Wt Readings from Last 3 Encounters:  03/09/24 127 lb 6.4 oz (57.8 kg)   10/27/23 111 lb 12.8 oz (50.7 kg)  10/03/23 123 lb (55.8 kg)    Physical Exam Constitutional:      General: He is not in acute distress.    Appearance: Normal appearance.  HENT:     Head: Normocephalic.     Right Ear: Tympanic membrane normal.     Left Ear: Tympanic membrane normal.     Nose: Nose normal.     Mouth/Throat:     Mouth: Mucous membranes are moist.     Pharynx: Oropharynx is clear.  Eyes:     Conjunctiva/sclera: Conjunctivae normal.     Pupils: Pupils are equal, round, and reactive to light.  Neck:     Thyroid: No thyromegaly.  Cardiovascular:     Rate and Rhythm: Normal rate and regular rhythm.     Heart sounds: Normal heart sounds.  Pulmonary:     Effort: Pulmonary effort is normal.     Breath sounds: Normal breath sounds.  Abdominal:     General: Abdomen is flat. Bowel sounds are normal.     Palpations: Abdomen is soft. There is no mass.     Tenderness: There is no abdominal tenderness.  Musculoskeletal:        General: Normal range of motion.  Lymphadenopathy:     Cervical: No cervical adenopathy.  Skin:    General: Skin is warm and dry.     Findings: No rash.  Neurological:     General: No focal deficit present.     Mental Status: He is  alert.  Psychiatric:        Mood and Affect: Mood normal.        Behavior: Behavior normal.      Assessment/Plan: Please see individual problem list.+  Routine general medical examination at a health care facility Assessment & Plan: Physical exam complete. Routine visit with no acute concerns. Previous abdominal pain resolved after medication discontinuation. No family history of colon or prostate cancer. Declined lab work today which I feel is reasonable given age and health status. Administered tetanus booster. Declines flu vaccine and additional COVID vaccines. Continue with regular dental and eye exams. Encourage continued healthy lifestyle with balanced diet and regular exercise. Advise follow-up with GI if  abdominal symptoms recur. Return to care in one year, sooner as needed.    Need for Tdap vaccination -     Tdap vaccine greater than or equal to 7yo IM      Return in about 1 year (around 03/09/2025) for Annual Exam, sooner as needed.   Leron Glance, NP-C [facility] [place]    [1]  Social History Tobacco Use  Smoking Status Never  Smokeless Tobacco Never  [2]  No current outpatient medications on file prior to visit.   No current facility-administered medications on file prior to visit.   "
